# Patient Record
Sex: Male | Born: 1995 | Race: White | Hispanic: No | Marital: Single | Smoking: Never smoker
Health system: Southern US, Community
[De-identification: ages and names within clinical notes are randomized; demographics above are authoritative.]

## PROBLEM LIST (undated history)

## (undated) DIAGNOSIS — I10 Essential (primary) hypertension: Secondary | ICD-10-CM

---
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
## 2016-09-23 ENCOUNTER — Emergency Department
Admission: EM | Admit: 2016-09-23 | Discharge: 2016-09-24 | Disposition: A | Discharge status: Home / Self Care | Payer: BLUE CROSS/BLUE SHIELD | Attending: Emergency Medicine | Admitting: Emergency Medicine

## 2016-09-23 DIAGNOSIS — R0789 Other chest pain: Secondary | ICD-10-CM | POA: Insufficient documentation

## 2016-09-23 DIAGNOSIS — R42 Dizziness and giddiness: Secondary | ICD-10-CM | POA: Diagnosis not present

## 2016-09-23 DIAGNOSIS — R079 Chest pain, unspecified: Secondary | ICD-10-CM

## 2016-09-23 LAB — COMPREHENSIVE METABOLIC PANEL
ALBUMIN: 4.8 g/dL (ref 3.5–5.0)
ALT: 39 U/L (ref 17–63)
AST: 33 U/L (ref 15–41)
Alkaline Phosphatase: 72 U/L (ref 38–126)
Anion gap: 8 (ref 5–15)
BUN: 14 mg/dL (ref 6–20)
CHLORIDE: 104 mmol/L (ref 101–111)
CO2: 24 mmol/L (ref 22–32)
Calcium: 9.7 mg/dL (ref 8.9–10.3)
Creatinine, Ser: 1.05 mg/dL (ref 0.61–1.24)
GFR calc Af Amer: >60 mL/min (ref >60)
Glucose, Bld: 96 mg/dL (ref 65–99)
POTASSIUM: 3.8 mmol/L (ref 3.5–5.1)
Sodium: 136 mmol/L (ref 135–145)
Total Bilirubin: 0.3 mg/dL (ref 0.3–1.2)
Total Protein: 8.3 g/dL — ABNORMAL HIGH (ref 6.5–8.1)

## 2016-09-23 LAB — CBC
HCT: 44.4 % (ref 40.0–52.0)
Hemoglobin: 15.7 g/dL (ref 13.0–18.0)
MCH: 28.2 pg (ref 26.0–34.0)
MCHC: 35.3 g/dL (ref 32.0–36.0)
MCV: 79.9 fL — ABNORMAL LOW (ref 80.0–100.0)
PLATELETS: 377 10*3/uL (ref 150–440)
RBC: 5.55 MIL/uL (ref 4.40–5.90)
RDW: 13.9 % (ref 11.5–14.5)
WBC: 15.1 10*3/uL — AB (ref 3.8–10.6)

## 2016-09-23 LAB — TROPONIN I

## 2016-09-23 NOTE — ED Triage Notes (Signed)
Pt had episode today of chest pain and dizziness today, no hx of the same. Pt was going through a physical training when this happened.

## 2016-09-23 NOTE — ED Notes (Signed)
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

## 2016-09-23 NOTE — ED Notes (Signed)
The EKG was completed and Dr. Pershing ProudSchaevitz signed off on it. @ 10:53pm

## 2016-09-24 ENCOUNTER — Emergency Department: Payer: BLUE CROSS/BLUE SHIELD

## 2016-09-24 ENCOUNTER — Encounter: Payer: Self-pay | Admitting: Radiology

## 2016-09-24 LAB — TROPONIN I

## 2016-09-24 MED ORDER — IOPAMIDOL (ISOVUE-300) INJECTION 61%
75.0000 mL | Freq: Once | INTRAVENOUS | Status: AC | PRN
  Administered 2016-09-24: 75 mL via INTRAVENOUS

## 2016-09-24 NOTE — Discharge Instructions (Signed)
Please follow up with either your pediatrician or the acute care clinic. He can take Tylenol or ibuprofen if he develop any worsening pain.

## 2016-09-24 NOTE — ED Provider Notes (Signed)
[facility] Emergency Department Provider Note   ____________________________________________   First MD Initiated Contact with Patient 09/23/16 2355     (approximate)  I have reviewed the triage vital signs and the nursing notes.   HISTORY  Chief Complaint Chest Pain    HPI Caleb Kirk is a 21 y.o. male who comes into the hospital today with some chest pain. He reports that he was doing some [place] with [employer] and he fell through a hole. He said he landed on his back approximately 3 feet down and he got a wave knocked out of him. After the fall though he did have some chest pain and pressure and felt lightheaded. He reports he was taken outside and he checked his blood pressure. The patient's blood pressure at that time was 190s over 124. He states that it wouldn't go down and by the time he arrived here he was still in the knees. He reports that this occurred around 8:00. The patient reports that he was crawling with a lot of gear when it occurred. He states that he is unsure if he hit his head but he did not pass out. He reports that the chest pain was on and off but he is not having any right now. He has been eating and drinking well but he was concerned about this pain so he comes into the hospital today for evaluation.   History reviewed. No pertinent past medical history.  There are no active problems to display for this patient.   History reviewed. No pertinent surgical history.  Prior to Admission medications   Not on File    Allergies Patient has no known allergies.  No family history on file.  Social History Social History  Substance Use Topics  . Smoking status: Never Smoker  . Smokeless tobacco: Not on file  . Alcohol use No    Review of Systems Constitutional: No fever/chills Eyes: No visual changes. ENT: No sore throat. Cardiovascular:  chest pain. Respiratory: Denies shortness of  breath. Gastrointestinal: No abdominal pain.  No nausea, no vomiting.  No diarrhea.  No constipation. Genitourinary: Negative for dysuria. Musculoskeletal: Negative for back pain. Skin: Negative for rash. Neurological: Lightheaded and dizzy  10-point ROS otherwise negative.  ____________________________________________   PHYSICAL EXAM:  VITAL SIGNS: ED Triage Vitals  Enc Vitals Group     BP 09/23/16 2242 (!) 163/85     Pulse Rate 09/23/16 2242 94     Resp 09/23/16 2242 18     Temp 09/23/16 2242 98.4 F (36.9 C)     Temp Source 09/23/16 2242 Oral     SpO2 09/23/16 2242 97 %     Weight 09/23/16 2243 260 lb (117.9 kg)     Height 09/23/16 2243 6\' 3"  (1.905 m)     Head Circumference --      Peak Flow --      Pain Score 09/23/16 2243 0     Pain Loc --      Pain Edu? --      Excl. in GC? --     Constitutional: Alert and oriented. Well appearing and in mild distress. Eyes: Conjunctivae are normal. PERRL. EOMI. Head: Atraumatic. Nose: No congestion/rhinnorhea. Mouth/Throat: Mucous membranes are moist.  Oropharynx non-erythematous. Cardiovascular: Normal rate, regular rhythm. Grossly normal heart sounds.  Good peripheral circulation. Respiratory: Normal respiratory effort.  No retractions. Lungs CTAB. Gastrointestinal: Soft and nontender. No distention. Positive bowel sounds Musculoskeletal: No lower extremity tenderness nor  edema.  Neurologic:  Normal speech and language.  Skin:  Skin is warm, dry and intact.  Psychiatric: Mood and affect are normal.   ____________________________________________   LABS (all labs ordered are listed, but only abnormal results are displayed)  Labs Reviewed  CBC - Abnormal; Notable for the following:       Result Value   WBC 15.1 (*)    MCV 79.9 (*)    All other components within normal limits  COMPREHENSIVE METABOLIC PANEL - Abnormal; Notable for the following:    Total Protein 8.3 (*)    All other components within normal limits   TROPONIN I  TROPONIN I   ____________________________________________  EKG  ED ECG REPORT I, Rebecka Apley, the attending physician, personally viewed and interpreted this ECG.   Date: 09/24/2016  EKG Time: 2252  Rate: 100  Rhythm: normal sinus rhythm  Axis: normal  Intervals:none  ST&T Change: none  ____________________________________________  RADIOLOGY  CT chest ____________________________________________   PROCEDURES  Procedure(s) performed: None  Procedures  Critical Care performed: No  ____________________________________________   INITIAL IMPRESSION / ASSESSMENT AND PLAN / ED COURSE  Pertinent labs & imaging results that were available during my care of the patient were reviewed by me and considered in my medical decision making (see chart for details).  This is a 21 year old male who comes into the hospital today with some chest pain after falling onto his back down approximately 3 feet. Given the mechanism of action of his injury I did perform a CT scan. The patient's CT scan is unremarkable and his pain is improved. I will repeat a troponin given the pressure component and if that is unremarkable the patient will be discharged home.  Clinical Course as of Sep 25 310  Wed Sep 24, 2016  0214 Mild bronchial wall thickening associated with bronchitis and/or reactive airway disease without focal consolidation.   CT Chest W Contrast [AW]    Clinical Course User Index [AW] Rebecka Apley, MD    The patient's troponin is unremarkable he will be discharged home. ____________________________________________   FINAL CLINICAL IMPRESSION(S) / ED DIAGNOSES  Final diagnoses:  Chest pain, unspecified type      NEW MEDICATIONS STARTED DURING THIS VISIT:  New Prescriptions   No medications on file     Note:  This document was prepared using Dragon voice recognition software and [employer] include unintentional dictation errors.    Rebecka Apley, MD 09/24/16 [phone]

## 2016-10-01 DIAGNOSIS — I10 Essential (primary) hypertension: Secondary | ICD-10-CM | POA: Insufficient documentation

## 2017-08-02 ENCOUNTER — Encounter: Payer: Self-pay | Admitting: Emergency Medicine

## 2017-08-02 ENCOUNTER — Emergency Department
Admission: EM | Admit: 2017-08-02 | Discharge: 2017-08-02 | Disposition: A | Discharge status: Home / Self Care | Payer: BLUE CROSS/BLUE SHIELD | Attending: Emergency Medicine | Admitting: Emergency Medicine

## 2017-08-02 ENCOUNTER — Other Ambulatory Visit: Payer: Self-pay

## 2017-08-02 ENCOUNTER — Emergency Department: Payer: BLUE CROSS/BLUE SHIELD

## 2017-08-02 DIAGNOSIS — I10 Essential (primary) hypertension: Secondary | ICD-10-CM | POA: Diagnosis not present

## 2017-08-02 DIAGNOSIS — F17228 Nicotine dependence, chewing tobacco, with other nicotine-induced disorders: Secondary | ICD-10-CM | POA: Diagnosis not present

## 2017-08-02 DIAGNOSIS — R0789 Other chest pain: Secondary | ICD-10-CM

## 2017-08-02 DIAGNOSIS — R079 Chest pain, unspecified: Secondary | ICD-10-CM | POA: Diagnosis present

## 2017-08-02 HISTORY — DX: Essential (primary) hypertension: I10

## 2017-08-02 LAB — BASIC METABOLIC PANEL
ANION GAP: 9 (ref 5–15)
BUN: 13 mg/dL (ref 6–20)
CALCIUM: 9 mg/dL (ref 8.9–10.3)
CO2: 25 mmol/L (ref 22–32)
Chloride: 103 mmol/L (ref 101–111)
Creatinine, Ser: 0.98 mg/dL (ref 0.61–1.24)
Glucose, Bld: 108 mg/dL — ABNORMAL HIGH (ref 65–99)
POTASSIUM: 3.8 mmol/L (ref 3.5–5.1)
SODIUM: 137 mmol/L (ref 135–145)

## 2017-08-02 LAB — CBC
HEMATOCRIT: 44.1 % (ref 40.0–52.0)
HEMOGLOBIN: 15 g/dL (ref 13.0–18.0)
MCH: 27.7 pg (ref 26.0–34.0)
MCHC: 34 g/dL (ref 32.0–36.0)
MCV: 81.5 fL (ref 80.0–100.0)
Platelets: 402 10*3/uL (ref 150–440)
RBC: 5.41 MIL/uL (ref 4.40–5.90)
RDW: 13.6 % (ref 11.5–14.5)
WBC: 14.9 10*3/uL — AB (ref 3.8–10.6)

## 2017-08-02 LAB — TROPONIN I

## 2017-08-02 MED ORDER — OMEPRAZOLE 40 MG PO CPDR: 40.0000 mg | DELAYED_RELEASE_CAPSULE | Freq: Every day | ORAL | 0 refills | Status: AC

## 2017-08-02 NOTE — ED Provider Notes (Signed)
[facility] Emergency Department Provider Note  ____________________________________________   First MD Initiated Contact with Patient 08/02/17 [phone]     (approximate)  I have reviewed the triage vital signs and the nursing notes.   HISTORY  Chief Complaint Chest Pain    HPI Caleb Kirk is a 22 y.o. male who comes to the emergency department with atypical chest pain.  He was lying in bed asleep when he began to notice burning aching moderate severity lower chest pain rising in his chest associated with a bitter sour taste in his mouth.  The pain abated shortly after waking up.  It was nonexertional.  Not associated with nausea or vomiting.  He has had no fevers or chills.  No history of coronary artery disease or stroke.  No history of DVT or PE.  No recent leg swelling surgery or immobilization.  He does have a recent history of similar events and has been intermittently taking Zantac with minimal relief.  His pain is currently well controlled.  Past Medical History:  Diagnosis Date  . Hypertension     There are no active problems to display for this patient.   History reviewed. No pertinent surgical history.  Prior to Admission medications   Medication Sig Start Date End Date Taking? Authorizing Provider  omeprazole (PRILOSEC) 40 MG capsule Take 1 capsule (40 mg total) by mouth daily. 08/02/17 08/02/18  Merrily Brittle, MD    Allergies Patient has no known allergies.  No family history on file.  Social History Social History   Tobacco Use  . Smoking status: Never Smoker  . Smokeless tobacco: Current User    Types: Chew  Substance Use Topics  . Alcohol use: No  . Drug use: No    Review of Systems Constitutional: No fever/chills Eyes: No visual changes. ENT: No sore throat. Cardiovascular: Positive for chest pain. Respiratory: Denies shortness of breath. Gastrointestinal: No abdominal pain.  No nausea, no vomiting.  No diarrhea.  No  constipation. Genitourinary: Negative for dysuria. Musculoskeletal: Negative for back pain. Skin: Negative for rash. Neurological: Negative for headaches, focal weakness or numbness.   ____________________________________________   PHYSICAL EXAM:  VITAL SIGNS: ED Triage Vitals  Enc Vitals Group     BP 08/02/17 0456 (!) 157/84     Pulse Rate 08/02/17 0456 87     Resp 08/02/17 0456 18     Temp 08/02/17 0456 98.7 F (37.1 C)     Temp Source 08/02/17 0456 Oral     SpO2 08/02/17 0456 99 %     Weight 08/02/17 0457 288 lb (130.6 kg)     Height 08/02/17 0457 6' 3.5" (1.918 m)     Head Circumference --      Peak Flow --      Pain Score 08/02/17 0455 10     Pain Loc --      Pain Edu? --      Excl. in GC? --     Constitutional: Alert and oriented x4 well-appearing nontoxic no diaphoresis speaks in full clear sentences Eyes: PERRL EOMI. Head: Atraumatic. Nose: No congestion/rhinnorhea. Mouth/Throat: No trismus Neck: No stridor.  Able to lie completely flat with no jugular venous distention Cardiovascular: Normal rate, regular rhythm. Grossly normal heart sounds.  Good peripheral circulation. Respiratory: Normal respiratory effort.  No retractions. Lungs CTAB and moving good air Gastrointestinal: Obese soft nontender Musculoskeletal: No lower extremity edema   Neurologic:  Normal speech and language. No gross focal neurologic deficits are appreciated.  Skin:  Skin is warm, dry and intact. No rash noted. Psychiatric: Mood and affect are normal. Speech and behavior are normal.    ____________________________________________   DIFFERENTIAL includes but not limited to  Acute coronary syndrome, pericarditis, myocarditis, pulmonary embolism, aortic dissection, gastric reflux ____________________________________________   LABS (all labs ordered are listed, but only abnormal results are displayed)  Labs Reviewed  BASIC METABOLIC PANEL - Abnormal; Notable for the following  components:      Result Value   Glucose, Bld 108 (*)    All other components within normal limits  CBC - Abnormal; Notable for the following components:   WBC 14.9 (*)    All other components within normal limits  TROPONIN I    Labs reviewed by me with no signs of acute ischemia.  Slightly elevated __________________________________________  EKG  ED ECG REPORT I, Merrily BrittleNeil Destenee Guerry, the attending physician, personally viewed and interpreted this ECG.  Date: 08/02/2017 EKG Time:  Rate: 84 Rhythm: normal sinus rhythm QRS Axis: normal Intervals: normal ST/T Wave abnormalities: normal Narrative Interpretation: no evidence of acute ischemia  ____________________________________________  RADIOLOGY  Chest x-ray reviewed by me with no acute disease ____________________________________________   PROCEDURES  Procedure(s) performed: no  Procedures  Critical Care performed: no  Observation: no ____________________________________________   INITIAL IMPRESSION / ASSESSMENT AND PLAN / ED COURSE  Pertinent labs & imaging results that were available during my care of the patient were reviewed by me and considered in my medical decision making (see chart for details).  By the time I saw the patient he had a negative troponin, normal chest x-ray, and normal EKG.  His history is extremely atypical and not consistent with coronary artery disease.  He is obese and does present with some symptoms which are consistent with gastric reflux.  I had a lengthy discussion with the patient regarding lifestyle modification and as he is already failed an H2 blocker I will initiate him on a PPI.  He is discharged home in improved condition verbalizes understanding and agree with plan.      ____________________________________________   FINAL CLINICAL IMPRESSION(S) / ED DIAGNOSES  Final diagnoses:  Atypical chest pain      NEW MEDICATIONS STARTED DURING THIS VISIT:  New Prescriptions     OMEPRAZOLE (PRILOSEC) 40 MG CAPSULE    Take 1 capsule (40 mg total) by mouth daily.     Note:  This document was prepared using Dragon voice recognition software and [employer] include unintentional dictation errors.     Merrily Brittleifenbark, Neria Procter, MD 08/02/17 [phone]

## 2017-08-02 NOTE — ED Triage Notes (Signed)
Pt arrives ambulatory to triage with c/o chest pain from 1-2 weeks. Pt states that chest pain is radiating across chest but denies other cardiac symptoms. Pt is in NAD.

## 2017-08-02 NOTE — Discharge Instructions (Signed)
Please begin taking your omeprazole every day as prescribed and follow-up with your primary care physician as needed.  Return to the emergency department for any concerns.  It was a pleasure to take care of you today, and thank you for coming to our emergency department.  If you have any questions or concerns before leaving please ask the nurse to grab me and I'm more than happy to go through your aftercare instructions again.  If you were prescribed any opioid pain medication today such as Norco, Vicodin, Percocet, morphine, hydrocodone, or oxycodone please make sure you do not drive when you are taking this medication as it can alter your ability to drive safely.  If you have any concerns once you are home that you are not improving or are in fact getting worse before you can make it to your follow-up appointment, please do not hesitate to call 911 and come back for further evaluation.  Merrily BrittleNeil Laelyn Blumenthal, MD  Results for orders placed or performed during the hospital encounter of 08/02/17  Basic metabolic panel  Result Value Ref Range   Sodium 137 135 - 145 mmol/L   Potassium 3.8 3.5 - 5.1 mmol/L   Chloride 103 101 - 111 mmol/L   CO2 25 22 - 32 mmol/L   Glucose, Bld 108 (H) 65 - 99 mg/dL   BUN 13 6 - 20 mg/dL   Creatinine, Ser 9.600.98 0.61 - 1.24 mg/dL   Calcium 9.0 8.9 - 45.410.3 mg/dL   GFR calc non Af Amer >60 >60 mL/min   GFR calc Af Amer >60 >60 mL/min   Anion gap 9 5 - 15  CBC  Result Value Ref Range   WBC 14.9 (H) 3.8 - 10.6 K/uL   RBC 5.41 4.40 - 5.90 MIL/uL   Hemoglobin 15.0 13.0 - 18.0 g/dL   HCT 09.844.1 11.940.0 - 14.752.0 %   MCV 81.5 80.0 - 100.0 fL   MCH 27.7 26.0 - 34.0 pg   MCHC 34.0 32.0 - 36.0 g/dL   RDW 82.913.6 56.211.5 - 13.014.5 %   Platelets 402 150 - 440 K/uL  Troponin I  Result Value Ref Range   Troponin I <0.03 <0.03 ng/mL   Dg Chest 2 View  Result Date: 08/02/2017 CLINICAL DATA:  Chest pain EXAM: CHEST  2 VIEW COMPARISON:  Chest CT 09/24/2016 FINDINGS: The heart size and  mediastinal contours are within normal limits. Both lungs are clear. The visualized skeletal structures are unremarkable. IMPRESSION: No active cardiopulmonary disease. Electronically Signed   By: Deatra RobinsonKevin  Herman M.D.   On: 08/02/2017 05:47

## 2017-12-25 IMAGING — CT CT CHEST W/ CM
2 of 3 series · 15 of 36 positions shown, 18 images · IV contrast (iopamidol)
Comparison: None.

CLINICAL DATA: Chest pain and hypertensive after fall through a
hole while fire training today. Headache.

EXAM:
CT CHEST WITH CONTRAST
TECHNIQUE: Multidetector CT imaging of the chest was performed during
intravenous contrast administration.
CONTRAST:  75mL TXFEXC-ETT IOPAMIDOL (TXFEXC-ETT) INJECTION 61%

[Series 2: axial st · axial · 0.80mm/px · z∈[-497,-221]mm · 12 of 164 slices shown, 15 images]
[im 13/164  mediastinal]
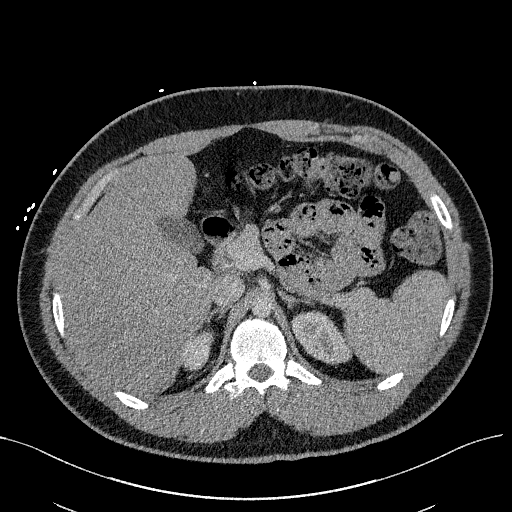
[im 13/164  lung]
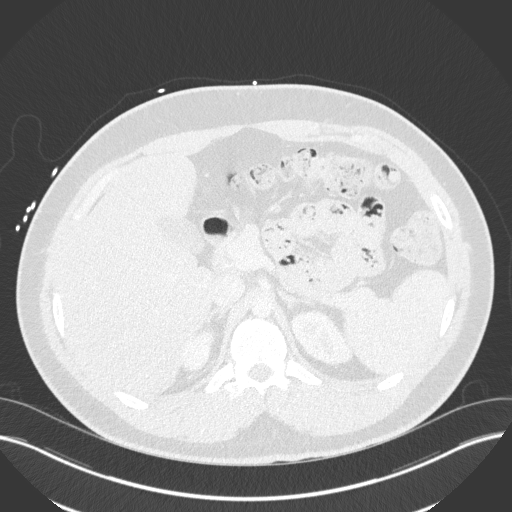
[im 25/164  lung]
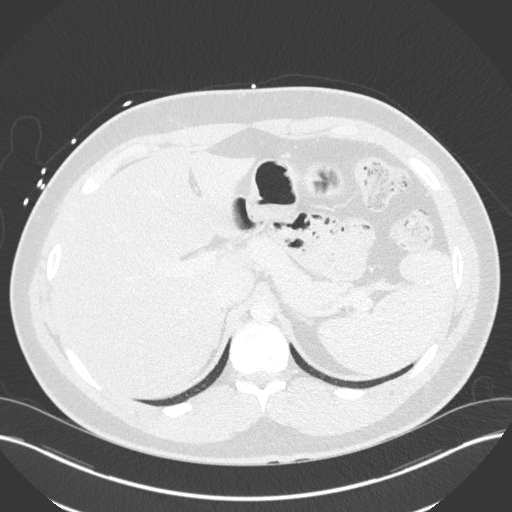
[im 37/164  lung]
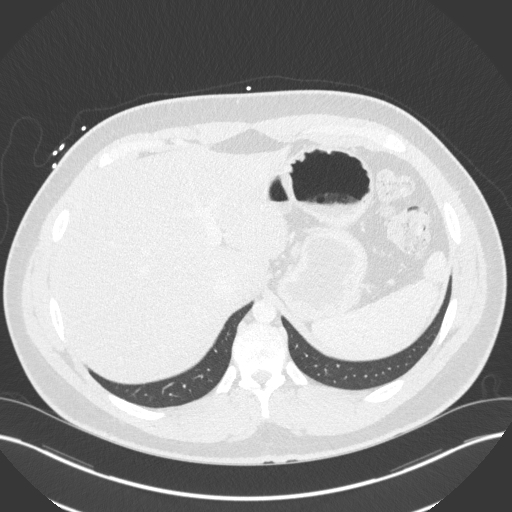
[im 49/164  lung]
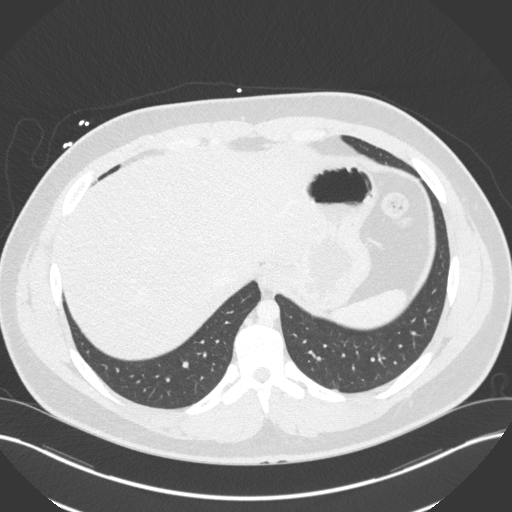
[im 61/164  mediastinal]
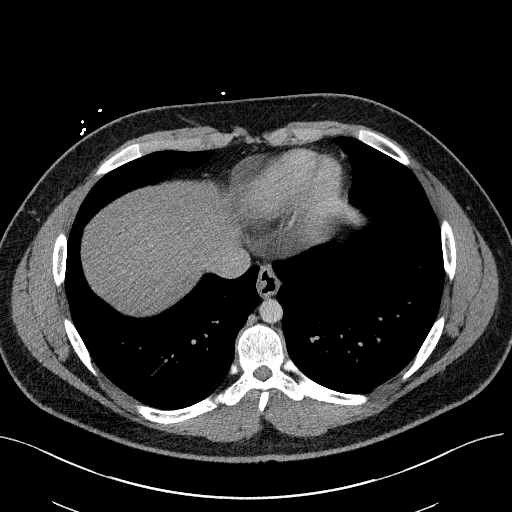
[im 61/164  lung]
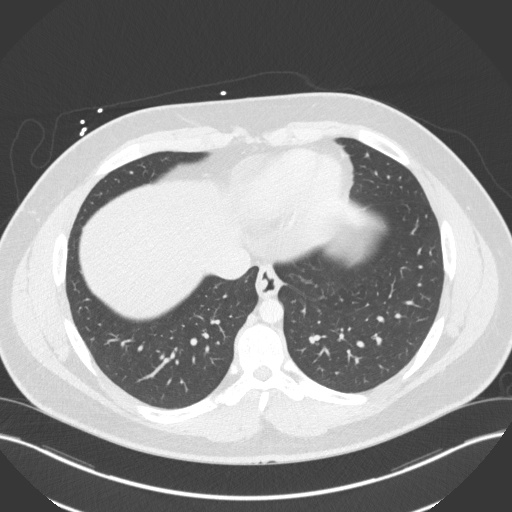
[im 73/164  lung]
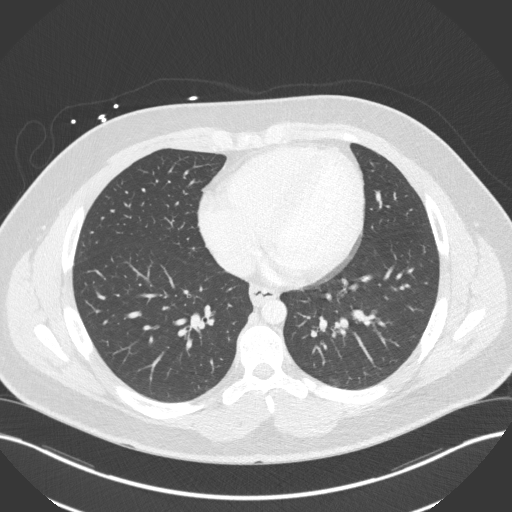
[im 91/164  lung]
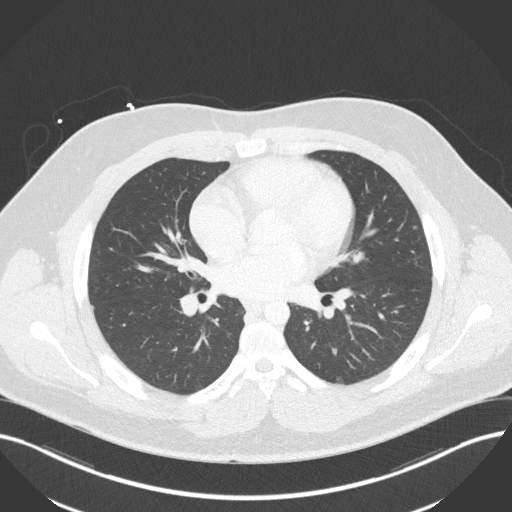
[im 103/164  lung]
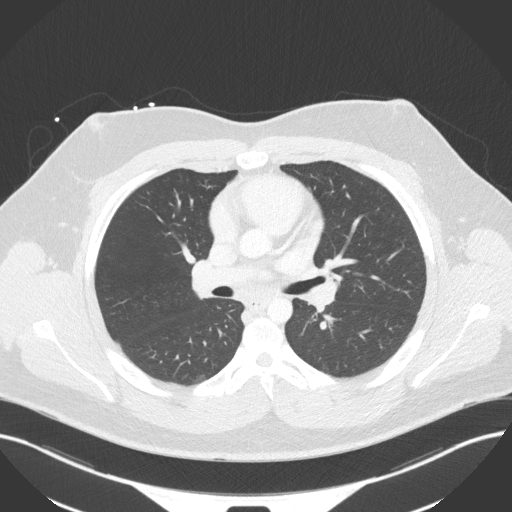
[im 115/164  mediastinal]
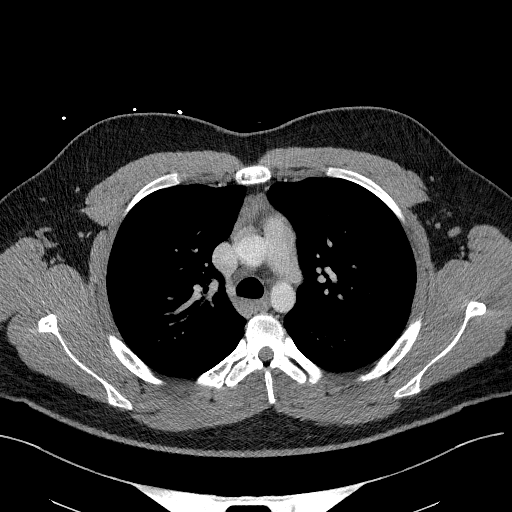
[im 115/164  lung]
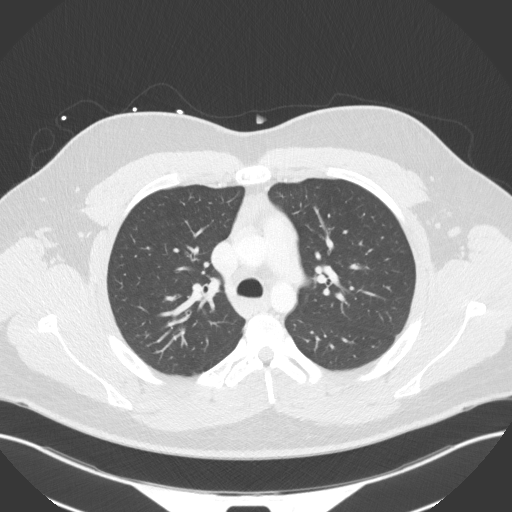
[im 127/164  lung]
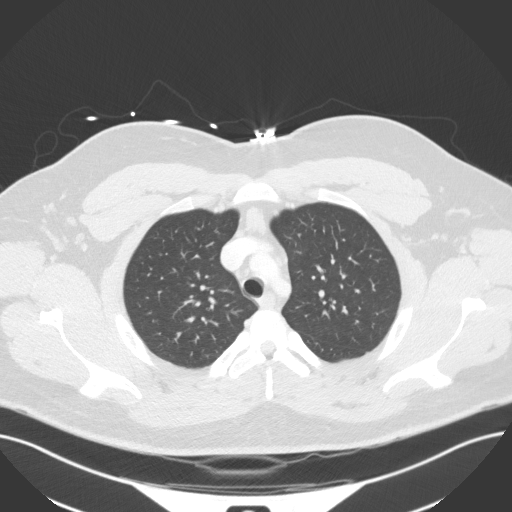
[im 139/164  lung]
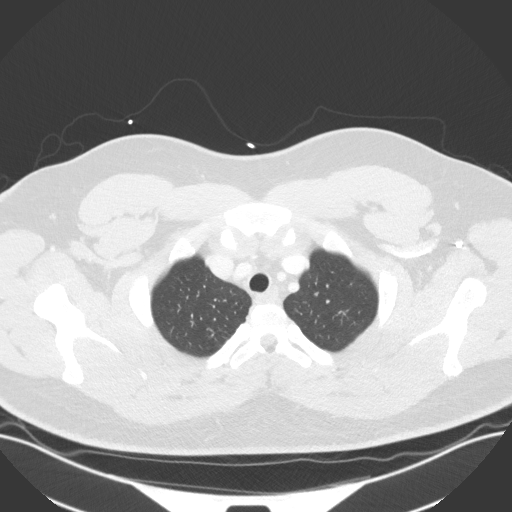
[im 151/164  lung]
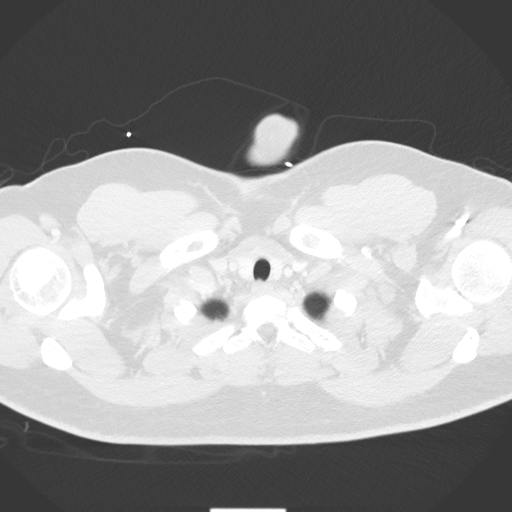

[Series 5: coronal · coronal · 0.66mm/px · 3 of 138 slices shown]
[im 28/138  lung]
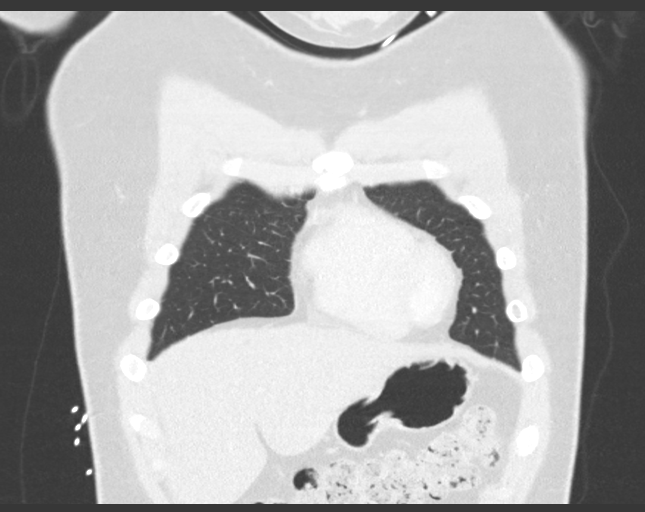
[im 55/138  lung]
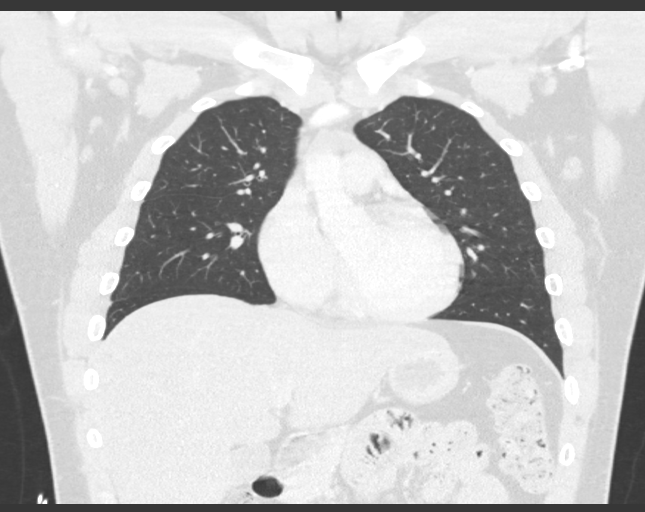
[im 83/138  lung]
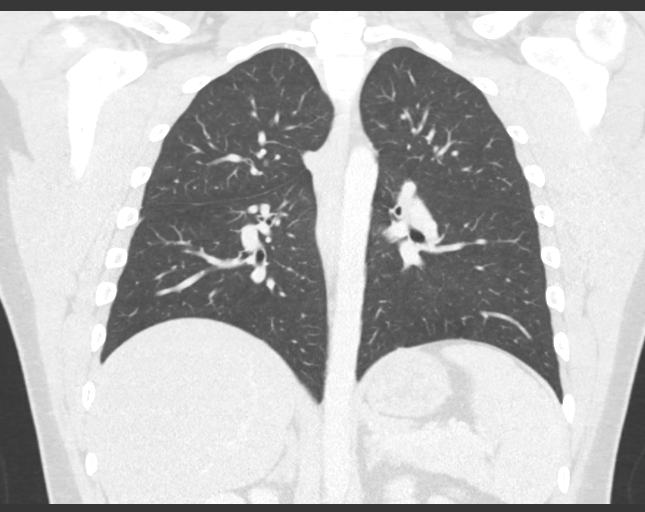

[15 of 36 positions shown; findings below may reference images not displayed]

FINDINGS: CARDIOVASCULAR: Heart and pericardium are unremarkable. Thoracic
aorta is normal course and caliber, unremarkable.

MEDIASTINUM/NODES: No mediastinal mass ; residual thymic tissue. No
lymphadenopathy by CT size criteria. Mildly thickened esophagus
though, underdistended.

LUNGS/PLEURA: Tracheobronchial tree is patent, no pneumothorax. Mild
bronchial wall thickening . No pleural effusions, focal
consolidations, pulmonary nodules or masses.

UPPER ABDOMEN: Nonacute.

MUSCULOSKELETAL: Included soft tissues and included osseous
structures are nonacute. Multilevel Schmorl's nodes. Segmented
sternum.
IMPRESSION: Mild bronchial wall thickening associated with bronchitis and/or
reactive airway disease without focal consolidation.

## 2018-08-17 ENCOUNTER — Ambulatory Visit (INDEPENDENT_AMBULATORY_CARE_PROVIDER_SITE_OTHER): Payer: BLUE CROSS/BLUE SHIELD

## 2018-08-17 ENCOUNTER — Encounter: Payer: Self-pay | Admitting: Emergency Medicine

## 2018-08-17 ENCOUNTER — Other Ambulatory Visit: Payer: Self-pay

## 2018-08-17 ENCOUNTER — Ambulatory Visit
Admission: EM | Admit: 2018-08-17 | Discharge: 2018-08-17 | Disposition: A | Discharge status: Home / Self Care | Payer: BLUE CROSS/BLUE SHIELD | Attending: Family Medicine | Admitting: Family Medicine

## 2018-08-17 DIAGNOSIS — M25562 Pain in left knee: Secondary | ICD-10-CM | POA: Diagnosis not present

## 2018-08-17 DIAGNOSIS — Y9367 Activity, basketball: Secondary | ICD-10-CM

## 2018-08-17 MED ORDER — MELOXICAM 15 MG PO TABS: 15.0000 mg | ORAL_TABLET | Freq: Every day | ORAL | 0 refills | Status: AC | PRN

## 2018-08-17 NOTE — Discharge Instructions (Addendum)
Rest, ice, compression, elevation.  Medication as prescribed.  If persists and does not improve, see [facility] in [place].  Take care  Dr. Adriana Simas

## 2018-08-17 NOTE — ED Provider Notes (Signed)
[facility]    CSN: [id number] Arrival date & time: 08/17/18  [id number]  History   Chief Complaint Chief Complaint  Patient presents with  . Knee Pain    left    HPI  23 year old male presents with left knee pain.  Patient was playing basketball last night.  Came down on his knee and subsequently injured it.  He is not exactly sure of the mechanism of injury.  He does state that he heard a pop.  Complains of left knee pain particularly on the lateral aspect.  He has used ibuprofen and Tylenol as well as ice the area without improvement.  Reports painful ambulation.  Worse with activity.  No relieving factors.  No other associated symptoms.  No other complaints.  PMH, Surgical Hx, Family Hx, Social History reviewed and updated as below.  Past Medical History:  Diagnosis Date  . Hypertension    History reviewed. No pertinent surgical history.  Home Medications    Prior to Admission medications   Medication Sig Start Date End Date Taking? Authorizing Provider  omeprazole (PRILOSEC) 40 MG capsule Take 1 capsule (40 mg total) by mouth daily. 08/02/17 08/17/18 Yes Merrily Brittleifenbark, Neil, MD  meloxicam (MOBIC) 15 MG tablet Take 1 tablet (15 mg total) by mouth daily as needed. 08/17/18   Tommie Samsook, Annelle Behrendt G, DO    Family History Family History  Problem Relation Age of Onset  . Healthy Mother     Social History Social History   Tobacco Use  . Smoking status: Never Smoker  . Smokeless tobacco: Current User    Types: Chew  Substance Use Topics  . Alcohol use: No  . Drug use: No     Allergies   Patient has no known allergies.   Review of Systems Review of Systems  Constitutional: Negative.   Musculoskeletal:       Left knee pain.    Physical Exam Triage Vital Signs ED Triage Vitals  Enc Vitals Group     BP 08/17/18 0904 (!) 152/81     Pulse Rate 08/17/18 0904 88     Resp 08/17/18 0904 16     Temp 08/17/18 0904 98.3 F (36.8 C)     Temp Source 08/17/18 0904 Oral   SpO2 08/17/18 0904 98 %     Weight 08/17/18 0902 280 lb (127 kg)     Height 08/17/18 0902 6\' 4"  (1.93 m)     Head Circumference --      Peak Flow --      Pain Score 08/17/18 0901 4     Pain Loc --      Pain Edu? --      Excl. in GC? --    Updated Vital Signs BP (!) 152/81 (BP Location: Left Arm)   Pulse 88   Temp 98.3 F (36.8 C) (Oral)   Resp 16   Ht 6\' 4"  (1.93 m)   Wt 127 kg   SpO2 98%   BMI 34.08 kg/m   Visual Acuity Right Eye Distance:   Left Eye Distance:   Bilateral Distance:    Right Eye Near:   Left Eye Near:    Bilateral Near:     Physical Exam Vitals signs and nursing note reviewed.  Constitutional:      General: He is not in acute distress.    Appearance: Normal appearance. He is not ill-appearing.  HENT:     Head: Normocephalic and atraumatic.  Eyes:     General:  Right eye: No discharge.        Left eye: No discharge.     Conjunctiva/sclera: Conjunctivae normal.  Pulmonary:     Effort: Pulmonary effort is normal. No respiratory distress.  Musculoskeletal:     Comments: Knee: Left  Normal to inspection with no erythema or effusion or obvious bony abnormalities.  Palpation - lateral joint line tenderness.  Ligaments intact.    Skin:    General: Skin is warm.     Findings: No bruising or rash.  Neurological:     Mental Status: He is alert.  Psychiatric:        Mood and Affect: Mood normal.        Behavior: Behavior normal.    UC Treatments / Results  Labs (all labs ordered are listed, but only abnormal results are displayed) Labs Reviewed - No data to display  EKG None  Radiology Dg Knee Complete 4 Views Left  Result Date: 08/17/2018 CLINICAL DATA:  Basketball injury.  Lateral knee pain. EXAM: LEFT KNEE - COMPLETE 4+ VIEW COMPARISON:  None. FINDINGS: No acute bony abnormality. Specifically, no fracture, subluxation, or dislocation. No joint effusion. Joint spaces maintained. IMPRESSION: Negative. Electronically Signed   By: Charlett Nose M.D.   On: 08/17/2018 10:08    Procedures Procedures (including critical care time)  Medications Ordered in UC Medications - No data to display  Initial Impression / Assessment and Plan / UC Course  I have reviewed the triage vital signs and the nursing notes.  Pertinent labs & imaging results that were available during my care of the patient were reviewed by me and considered in my medical decision making (see chart for details).    23 year old male presents with left knee pain/injury.  Concern for possible meniscal tear given by tenderness.  X-ray negative.  Advised rest, ice, compression, elevation.  Meloxicam as directed.  If persists & he does not improve, should see Ortho.  Final Clinical Impressions(s) / UC Diagnoses   Final diagnoses:  Acute pain of left knee     Discharge Instructions     Rest, ice, compression, elevation.  Medication as prescribed.  If persists and does not improve, see [facility] in [place].  Take care  Dr. Adriana Simas    ED Prescriptions    Medication Sig Dispense Auth. Provider   meloxicam (MOBIC) 15 MG tablet Take 1 tablet (15 mg total) by mouth daily as needed. 30 tablet Tommie Sams, DO     Controlled Substance Prescriptions [place] Controlled Substance Registry consulted? Not Applicable   Tommie Sams, DO 08/17/18 1032

## 2018-08-17 NOTE — ED Triage Notes (Signed)
Patient states that he was playing basketball last night and fell and twisted his left knee.  Patient c/o left knee pain.

## 2018-09-17 ENCOUNTER — Other Ambulatory Visit: Payer: Self-pay | Admitting: Family Medicine

## 2018-11-02 IMAGING — CR DG CHEST 2V
1 series · 2 of 2 positions shown · non-contrast
Comparison: Chest CT 09/24/2016

CLINICAL DATA: Chest pain

EXAM:
CHEST  2 VIEW

[Series 1: dg chest 2 view · 0.14mm/px · 2 of 2 slices shown]
[im 1/2]
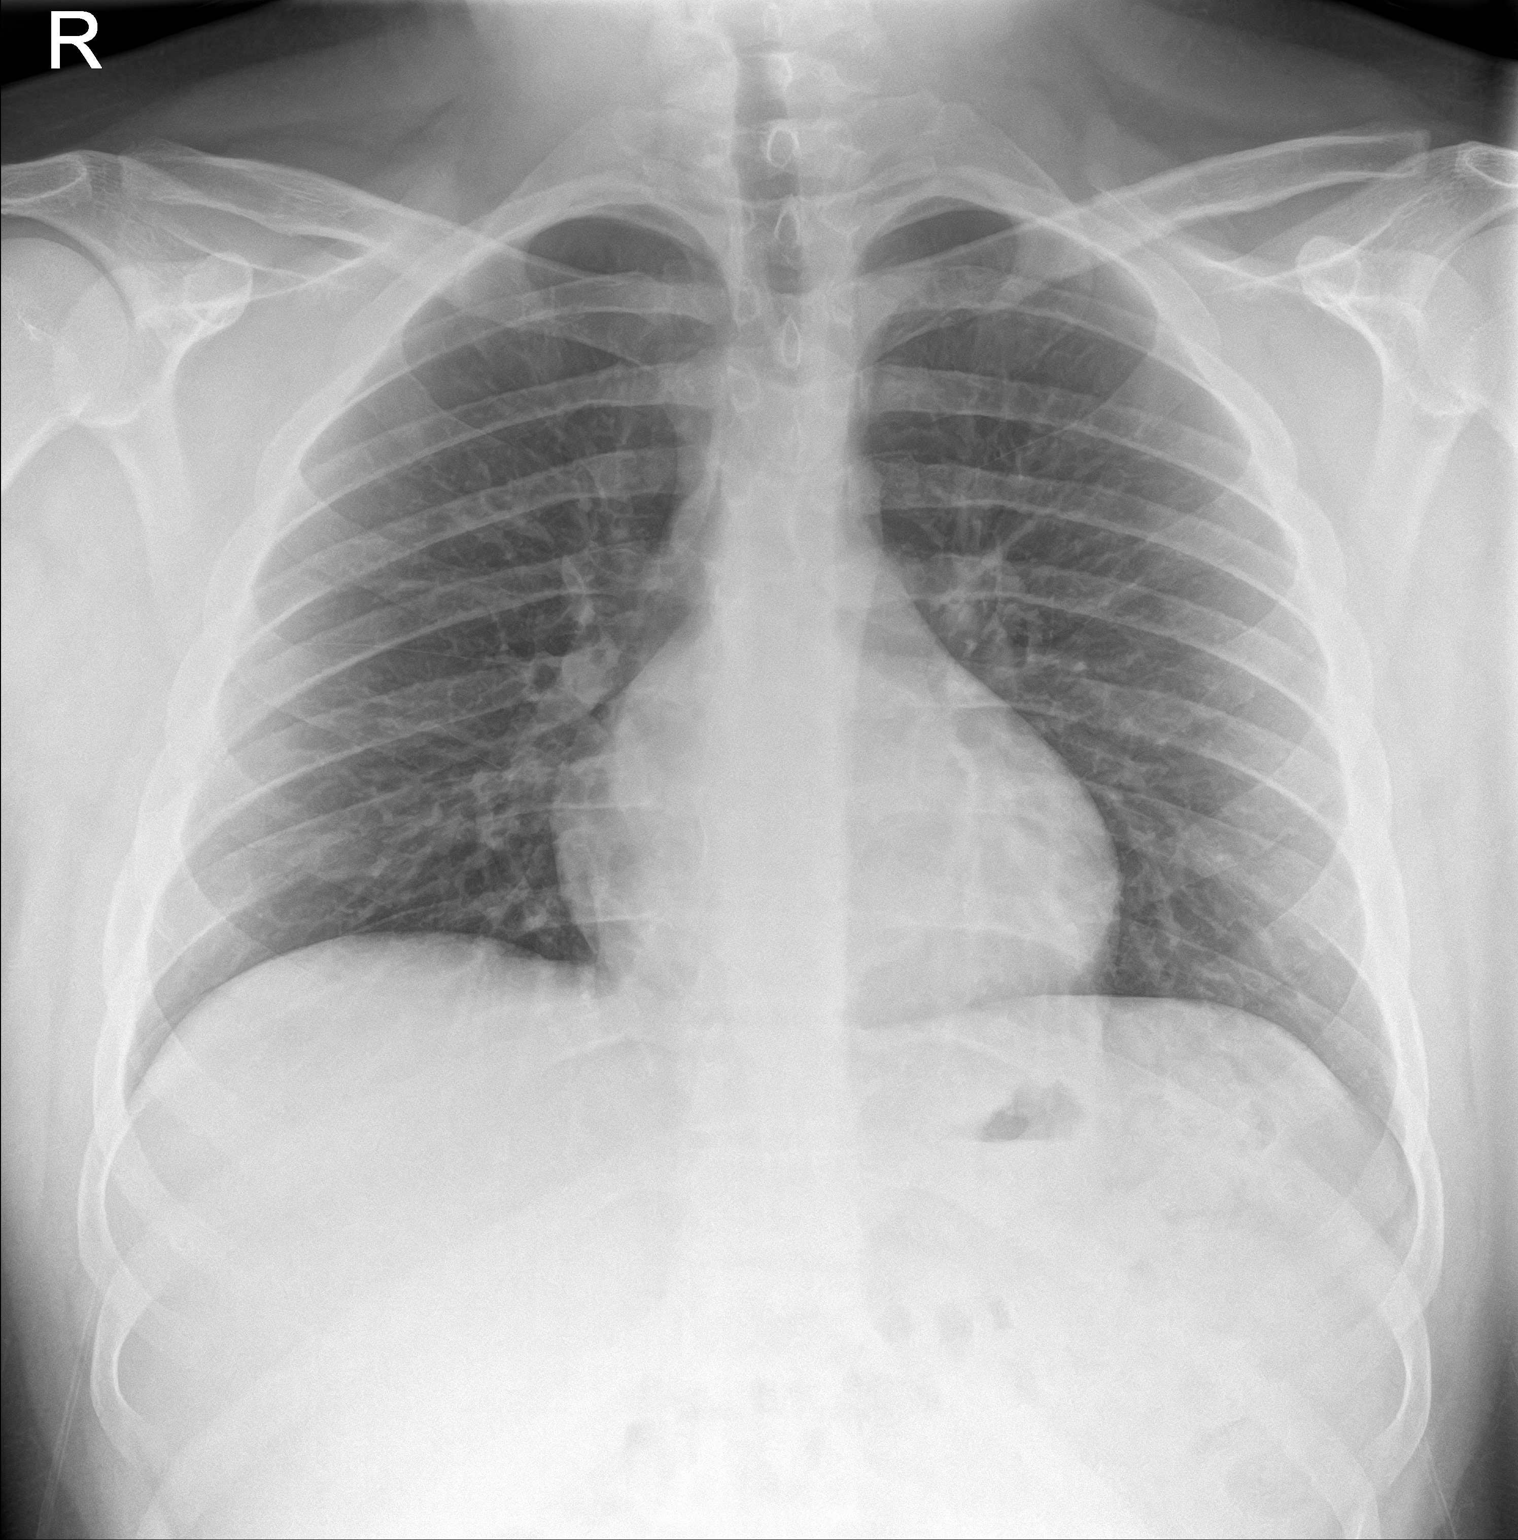
[im 2/2]
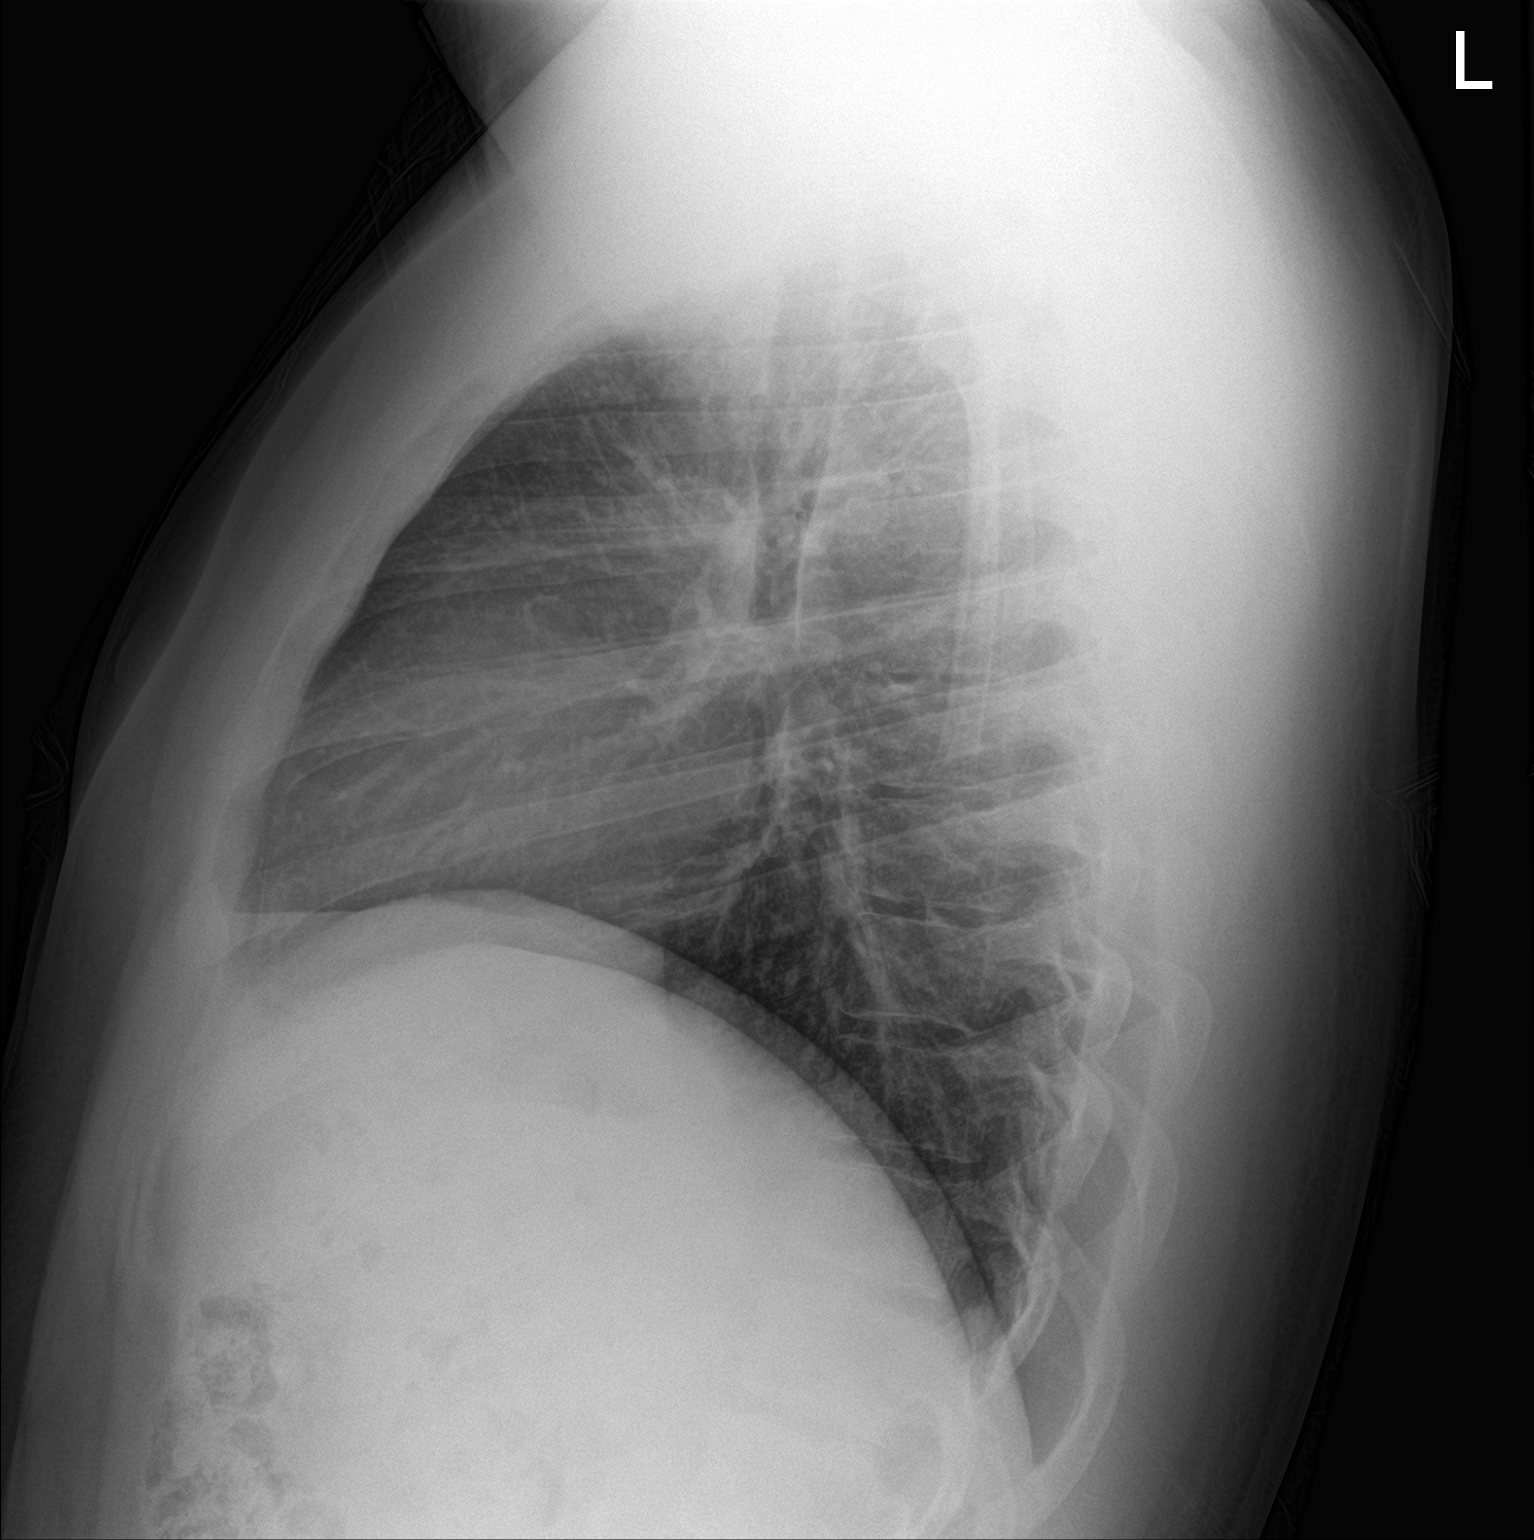

[2 of 2 positions shown; findings below may reference images not displayed]

FINDINGS: The heart size and mediastinal contours are within normal limits.
Both lungs are clear. The visualized skeletal structures are
unremarkable.
IMPRESSION: No active cardiopulmonary disease.

## 2019-07-13 ENCOUNTER — Ambulatory Visit: Payer: BC Managed Care – PPO | Attending: Internal Medicine

## 2019-07-13 DIAGNOSIS — Z20822 Contact with and (suspected) exposure to covid-19: Secondary | ICD-10-CM

## 2019-07-14 LAB — NOVEL CORONAVIRUS, NAA: SARS-CoV-2, NAA: NOT DETECTED

## 2019-07-18 ENCOUNTER — Telehealth: Payer: Self-pay

## 2019-07-18 NOTE — Telephone Encounter (Signed)
Called and informed patient that test for Covid 19 was NEGATIVE. Discussed signs and symptoms of Covid 19 : fever, chills, respiratory symptoms, cough, ENT symptoms, sore throat, SOB, muscle pain, diarrhea, headache, loss of taste/smell, close exposure to COVID-19 patient. Pt instructed to call PCP if they develop the above signs and sx. Pt also instructed to call 911 if having respiratory issues/distress. Discussed MyChart enrollment. Pt verbalized understanding. Pt stated that he began having covid like sx in the last couple of day (vomiting, chills, body aches and occasional cough. Explained to pt that he could have tested too early or it could be another disease process- advised pt to call his PCP to advised for retesting.

## 2019-08-19 DIAGNOSIS — K219 Gastro-esophageal reflux disease without esophagitis: Secondary | ICD-10-CM | POA: Insufficient documentation

## 2023-02-01 DIAGNOSIS — Z6828 Body mass index (BMI) 28.0-28.9, adult: Secondary | ICD-10-CM | POA: Insufficient documentation

## 2023-02-01 DIAGNOSIS — H47339 Pseudopapilledema of optic disc, unspecified eye: Secondary | ICD-10-CM | POA: Insufficient documentation

## 2023-02-01 DIAGNOSIS — H5213 Myopia, bilateral: Secondary | ICD-10-CM | POA: Insufficient documentation

## 2023-02-03 ENCOUNTER — Encounter: Payer: Self-pay | Admitting: Nurse Practitioner

## 2023-02-03 DIAGNOSIS — Z0289 Encounter for other administrative examinations: Secondary | ICD-10-CM
